# Patient Record
Sex: Male | Born: 1995 | Race: Black or African American | Hispanic: No | Marital: Single | Smoking: Never smoker
Health system: Southern US, Community
[De-identification: ages and names within clinical notes are randomized; demographics above are authoritative.]

## PROBLEM LIST (undated history)

## (undated) HISTORY — PX: ORIF FOOT FRACTURE: SHX2123

## (undated) HISTORY — PX: KNEE SURGERY: SHX244

---

## 2018-07-12 ENCOUNTER — Other Ambulatory Visit: Payer: Self-pay | Admitting: Hematology

## 2018-07-12 DIAGNOSIS — Z20822 Contact with and (suspected) exposure to covid-19: Secondary | ICD-10-CM

## 2018-07-12 NOTE — Progress Notes (Signed)
Patient is a Consulting civil engineer at [employer].  Referred for testing by Dr. Jolene Provost. Please send results to this provider.  Insurance information was not provided to me. /

## 2018-07-13 ENCOUNTER — Other Ambulatory Visit: Payer: Self-pay

## 2018-07-14 ENCOUNTER — Other Ambulatory Visit: Payer: Self-pay

## 2018-07-14 DIAGNOSIS — Z20822 Contact with and (suspected) exposure to covid-19: Secondary | ICD-10-CM

## 2018-07-16 LAB — NOVEL CORONAVIRUS, NAA: SARS-CoV-2, NAA: NOT DETECTED

## 2018-08-17 ENCOUNTER — Other Ambulatory Visit: Payer: Self-pay | Admitting: *Deleted

## 2018-08-17 DIAGNOSIS — Z20822 Contact with and (suspected) exposure to covid-19: Secondary | ICD-10-CM

## 2018-08-21 ENCOUNTER — Other Ambulatory Visit: Payer: Self-pay

## 2018-08-21 DIAGNOSIS — Z20822 Contact with and (suspected) exposure to covid-19: Secondary | ICD-10-CM

## 2018-08-26 LAB — NOVEL CORONAVIRUS, NAA: SARS-CoV-2, NAA: NOT DETECTED

## 2018-09-25 ENCOUNTER — Other Ambulatory Visit: Payer: Self-pay

## 2018-09-25 DIAGNOSIS — Z20822 Contact with and (suspected) exposure to covid-19: Secondary | ICD-10-CM

## 2018-09-26 LAB — NOVEL CORONAVIRUS, NAA: SARS-CoV-2, NAA: NOT DETECTED

## 2018-10-31 ENCOUNTER — Telehealth (INDEPENDENT_AMBULATORY_CARE_PROVIDER_SITE_OTHER): Payer: Self-pay | Admitting: Family Medicine

## 2018-10-31 ENCOUNTER — Other Ambulatory Visit: Payer: Self-pay

## 2018-10-31 DIAGNOSIS — Z20828 Contact with and (suspected) exposure to other viral communicable diseases: Secondary | ICD-10-CM

## 2018-10-31 DIAGNOSIS — R05 Cough: Secondary | ICD-10-CM

## 2018-10-31 DIAGNOSIS — Z20822 Contact with and (suspected) exposure to covid-19: Secondary | ICD-10-CM

## 2018-10-31 DIAGNOSIS — R6883 Chills (without fever): Secondary | ICD-10-CM

## 2018-10-31 DIAGNOSIS — R509 Fever, unspecified: Secondary | ICD-10-CM

## 2018-11-02 LAB — NOVEL CORONAVIRUS, NAA: SARS-CoV-2, NAA: DETECTED — AB

## 2018-11-04 ENCOUNTER — Telehealth: Payer: Self-pay | Admitting: Family Medicine

## 2018-11-04 MED ORDER — ALBUTEROL SULFATE HFA 108 (90 BASE) MCG/ACT IN AERS: 2.0000 | INHALATION_SPRAY | RESPIRATORY_TRACT | 1 refills | Status: AC | PRN

## 2018-11-04 NOTE — Progress Notes (Signed)
Telemedicine Visit  Patient asked for a refill of his inhaler. Patient is currently in quarantine for being + for COVID-19. Admits to feeling better now but still has some chest and nasal congestion. Has some tightness in his chest after coughing. Has hx of asthma as a child and uses an inhale occasionally when has a cold. Denies wheesing, chest pain, or shortness of breath walking around in his room. Denies swelling or pain of any extremities. Denies fever, diarrhea, severe headache.   NAD on the phone. Can speak in complete sentences without stopping.  No Exam.  A/P: COVID-19, chest congestion - will refill Albuterol Inhaler, recommend going to the ER for further evaluation and treatment if no improvement with inhaler use after use. Patient addresses understanding. Will continue to monitor for other cardiopulmonary symptoms and will seek immediate medical attention if needed.

## 2018-11-13 ENCOUNTER — Encounter: Payer: Self-pay | Admitting: Cardiology

## 2018-11-15 ENCOUNTER — Telehealth (INDEPENDENT_AMBULATORY_CARE_PROVIDER_SITE_OTHER): Payer: BLUE CROSS/BLUE SHIELD | Admitting: Family Medicine

## 2018-11-15 ENCOUNTER — Other Ambulatory Visit: Payer: Self-pay | Admitting: Family Medicine

## 2018-11-15 DIAGNOSIS — I400 Infective myocarditis: Secondary | ICD-10-CM

## 2018-11-15 DIAGNOSIS — Z8619 Personal history of other infectious and parasitic diseases: Secondary | ICD-10-CM

## 2018-11-15 DIAGNOSIS — Z8616 Personal history of COVID-19: Secondary | ICD-10-CM

## 2018-11-15 DIAGNOSIS — U071 COVID-19: Secondary | ICD-10-CM

## 2018-11-15 NOTE — Progress Notes (Signed)
Virtual Visit Agreed To By Patient Patient states he started with symptoms of chills, minimal cough and fever yesterday. Has taken Dayquil and Ibuprofen. Denies CP, SOB, N/V/D, abdominal pain, sore throat, severe headache.   ROS: Negative except mentioned above. GENERAL: NAD *No Exam   A/P: Fever - will send patient for COVID-19 testing, Tylenol/Ibuprofen prn, rest, hydration, quarantine for now until results discussed with patient, seek medical attention if needed as discussed, no athletic activity for now, will email student concerns who will contact patient.

## 2018-11-20 ENCOUNTER — Other Ambulatory Visit: Payer: Self-pay

## 2018-11-20 ENCOUNTER — Ambulatory Visit (INDEPENDENT_AMBULATORY_CARE_PROVIDER_SITE_OTHER): Payer: BLUE CROSS/BLUE SHIELD

## 2018-11-20 DIAGNOSIS — U071 COVID-19: Secondary | ICD-10-CM

## 2018-11-20 DIAGNOSIS — Z8619 Personal history of other infectious and parasitic diseases: Secondary | ICD-10-CM

## 2018-11-20 DIAGNOSIS — Z8616 Personal history of COVID-19: Secondary | ICD-10-CM

## 2018-11-20 DIAGNOSIS — I400 Infective myocarditis: Secondary | ICD-10-CM | POA: Diagnosis not present

## 2018-11-22 ENCOUNTER — Other Ambulatory Visit: Payer: Self-pay | Admitting: Family Medicine

## 2018-11-22 ENCOUNTER — Telehealth: Payer: Self-pay | Admitting: Family Medicine

## 2018-11-22 DIAGNOSIS — R931 Abnormal findings on diagnostic imaging of heart and coronary circulation: Secondary | ICD-10-CM

## 2018-11-22 DIAGNOSIS — Z8619 Personal history of other infectious and parasitic diseases: Secondary | ICD-10-CM

## 2018-11-22 DIAGNOSIS — Z8616 Personal history of COVID-19: Secondary | ICD-10-CM

## 2018-11-22 NOTE — Telephone Encounter (Signed)
Called athlete and informed him that I had a conversation with Dr. Saunders Revel, the cardiologist who read his echocardiogram. Dr. Saunders Revel suggests patient be seen by Cardiology this week for further discussion/evaluation. Patient has hx of COVID-19 but currently denies any symptoms. Troponin level was normal. ECG read by Dr. Rockey Situ will be faxed to Cardiology office in the morning prior to patient's appointment. 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  Patient addresses understanding of plan and is agreeable.

## 2018-11-23 ENCOUNTER — Other Ambulatory Visit: Payer: Self-pay

## 2018-11-23 ENCOUNTER — Encounter: Payer: Self-pay | Admitting: Cardiology

## 2018-11-23 ENCOUNTER — Ambulatory Visit (INDEPENDENT_AMBULATORY_CARE_PROVIDER_SITE_OTHER): Payer: BLUE CROSS/BLUE SHIELD | Admitting: Cardiology

## 2018-11-23 VITALS — BP 110/80 | HR 76 | Ht 75.5 in | Wt 238.0 lb

## 2018-11-23 DIAGNOSIS — Z8619 Personal history of other infectious and parasitic diseases: Secondary | ICD-10-CM

## 2018-11-23 DIAGNOSIS — Z8616 Personal history of COVID-19: Secondary | ICD-10-CM

## 2018-11-23 NOTE — Progress Notes (Addendum)
Cardiology Office Note:    Date:  11/23/2018   ID:  Noah Shah, DOB 07-23-1995, MRN [id number]  PCP:  Paulina Fusi, MD  Cardiologist:  Kate Sable, MD  Electrophysiologist:  None   Referring MD: Paulina Fusi, MD   Chief Complaint  Patient presents with  . New Patient (Initial Visit)    referred by PCP for Abnormal ECHO. Meds reviewed verbally with patient.     History of Present Illness:    Noah Shah is a 23 y.o. male with a hx of being positive for COVID-19 and presents due to an abnormal echo.  Patient attends [employer] and plays football.  He has played football ever since he was a kid around age 5.  He had symptoms of muscle aches back pain, fevers last month and was eventually tested for the coronavirus on 10/31/2018 (3 weeks ago) which came back positive.  Patient was placed in self isolation with resolution of symptoms.  He is currently asymptomatic, denies chest pain, shortness of breath, fevers, chills.  Troponin level was tested and was normal as per PCP.  His ECG showed normal sinus rhythm, some interventricular conduction delay, otherwise normal electrocardiogram.  History reviewed. No pertinent past medical history.  Past Surgical History:  Procedure Laterality Date  . KNEE SURGERY     x2  . ORIF FOOT FRACTURE      Current Medications: Current Meds  Medication Sig  . albuterol (VENTOLIN HFA) 108 (90 Base) MCG/ACT inhaler Inhale 2 puffs into the lungs every 4 (four) hours as needed for wheezing or shortness of breath.     Allergies:   Patient has no allergy information on record.   Social History   Socioeconomic History  . Marital status: Single    Spouse name: Not on file  . Number of children: Not on file  . Years of education: Not on file  . Highest education level: Not on file  Occupational History  . Not on file  Social Needs  . Financial resource strain: Not on file  . Food insecurity    Worry: Not on file   Inability: Not on file  . Transportation needs    Medical: Not on file    Non-medical: Not on file  Tobacco Use  . Smoking status: Never Smoker  . Smokeless tobacco: Never Used  Substance and Sexual Activity  . Alcohol use: Never    Frequency: Never  . Drug use: Never  . Sexual activity: Not on file  Lifestyle  . Physical activity    Days per week: Not on file    Minutes per session: Not on file  . Stress: Not on file  Relationships  . Social Herbalist on phone: Not on file    Gets together: Not on file    Attends religious service: Not on file    Active member of club or organization: Not on file    Attends meetings of clubs or organizations: Not on file    Relationship status: Not on file  Other Topics Concern  . Not on file  Social History Narrative  . Not on file     Family History: The patient's. Denies family history of cardiac disease.  ROS:   Please see the history of present illness.     All other systems reviewed and are negative.  EKGs/Labs/Other Studies Reviewed:    The following studies were reviewed today:  Echocardiogram dated 11/20/2018 IMPRESSIONS    1. Left  ventricular ejection fraction, by visual estimation, is 50 to 55%. The left ventricle has normal function. Normal left ventricular size. There is no left ventricular hypertrophy.  2. Global right ventricle has normal systolic function.The right ventricular size is mildly enlarged. No increase in right ventricular wall thickness.  3. Left atrial size was normal.  4. Right atrial size was mildly dilated.  5. The mitral valve is normal in structure. Mild mitral valve regurgitation. No evidence of mitral stenosis.  6. The tricuspid valve is grossly normal. Tricuspid valve regurgitation is trivial.  7. The aortic valve has an indeterminant number of cusps Aortic valve regurgitation was not visualized by color flow Doppler. There is no aortic valve stenosis.  8. The pulmonic valve was  normal in structure. Pulmonic valve regurgitation is trivial by color flow Doppler.  9. TR signal is inadequate for assessing pulmonary artery systolic pressure. 10. The inferior vena cava is normal in size with greater than 50% respiratory variability, suggesting right atrial pressure of 3 mmHg. 11. The interatrial septum was not well visualized.  EKG:  EKG is not ordered ordered today.   ECG reviewed from PCP office showed normal sinus rhythm, some interventricular conduction delay, otherwise normal ECG.  Recent Labs: No results found for requested labs within last 8760 hours.  Recent Lipid Panel No results found for: CHOL, TRIG, HDL, CHOLHDL, VLDL, LDLCALC, LDLDIRECT  Physical Exam:    VS:  BP 110/80 (BP Location: Left Arm, Patient Position: Sitting, Cuff Size: Normal)   Pulse 76   Ht 6' 3.5" (1.918 m)   Wt 238 lb (108 kg)   BMI 29.36 kg/m     Wt Readings from Last 3 Encounters:  11/23/18 238 lb (108 kg)     GEN:  Well nourished, well developed in no acute distress HEENT: Normal NECK: No JVD; No carotid bruits LYMPHATICS: No lymphadenopathy CARDIAC: RRR, no murmurs, rubs, gallops RESPIRATORY:  Clear to auscultation without rales, wheezing or rhonchi  ABDOMEN: Soft, non-tender, non-distended MUSCULOSKELETAL:  No edema; No deformity  SKIN: Warm and dry NEUROLOGIC:  Alert and oriented x 3 PSYCHIATRIC:  Normal affect   ASSESSMENT:   His echocardiogram did reveal mild right atrial enlargement and mild right ventricular enlargement.  Troponin levels and ECG obtained from the PCP office was normal.  Patient is without symptoms.  With regards to cardiac involvement in light of coronavirus infection, a lot of the treatment options or guidelines is based on expert opinions.  It is even more difficult to ascertain etiology of cardiac changes in athletes because these patients by enlarge have some echocardiographic changes due to physiologic cardiac remodeling secondary to exercise.   With that said, leaning on one such recent guidelines from [facility] cardiology, Mr. Tomich troponin level was normal and his EKG was also normal which points away from myocarditis although still possible.  His echocardiogram showing mild RA and RV enlargement is likely secondary to athletic heart or cardiac remodeling secondary to exercise.  I do not think the mild chamber changes due to coronavirus infection or myocarditis.  1. History of 2019 novel coronavirus disease (COVID-19)    PLAN:    In order of problems listed above:  1. I recommend repeat echocardiogram in 3 to 6 months to make sure the current cardiac changes are stable or not worsening.  I advised slow resumption of athletic activity under the guidance of healthcare team with close monitoring for any clinical deterioration..  Total encounter time more than 45 minutes  Greater  than 50% was spent in counseling and coordination of care with the patient   Medication Adjustments/Labs and Tests Ordered: Current medicines are reviewed at length with the patient today.  Concerns regarding medicines are outlined above.  Orders Placed This Encounter  Procedures  . ECHOCARDIOGRAM COMPLETE   No orders of the defined types were placed in this encounter.   Patient Instructions  Medication Instructions:  Your physician recommends that you continue on your current medications as directed. Please refer to the Current Medication list given to you today.   If you need a refill on your cardiac medications before your next appointment, please call your pharmacy.   Lab work: NONE If you have labs (blood work) drawn today and your tests are completely normal, you will receive your results only by: Marland Kitchen. MyChart Message (if you have MyChart) OR . A paper copy in the mail If you have any lab test that is abnormal or we need to change your treatment, we will call you to review the results.  Testing/Procedures: Your physician has requested that  you have an echocardiogram IN 3-6 MONTHS. Echocardiography is a painless test that uses sound waves to create images of your heart. It provides your doctor with information about the size and shape of your heart and how well your heart's chambers and valves are working. This procedure takes approximately one hour. There are no restrictions for this procedure.    Follow-Up: At [facility], you and your health needs are our priority.  As part of our continuing mission to provide you with exceptional heart care, we have created designated Provider Care Teams.  These Care Teams include your primary Cardiologist (physician) and Advanced Practice Providers (APPs -  Physician Assistants and Nurse Practitioners) who all work together to provide you with the care you need, when you need it. You will need a follow up appointment in 3-6 months AFTER YOU HAVE HAD THE ECHO.  You may see Debbe OdeaBrian Agbor-Etang, MD or one of the following Advanced Practice Providers on your designated Care Team:   Nicolasa Duckinghristopher Berge, NP Eula Listenyan Dunn, PA-C . Marisue IvanJacquelyn Visser, PA-C         Signed, Debbe OdeaBrian Agbor-Etang, MD  11/23/2018 10:14 AM     [facility]

## 2018-11-23 NOTE — Patient Instructions (Signed)
Medication Instructions:  Your physician recommends that you continue on your current medications as directed. Please refer to the Current Medication list given to you today.   If you need a refill on your cardiac medications before your next appointment, please call your pharmacy.   Lab work: NONE If you have labs (blood work) drawn today and your tests are completely normal, you will receive your results only by: Marland Kitchen MyChart Message (if you have MyChart) OR . A paper copy in the mail If you have any lab test that is abnormal or we need to change your treatment, we will call you to review the results.  Testing/Procedures: Your physician has requested that you have an echocardiogram IN 3-6 MONTHS. Echocardiography is a painless test that uses sound waves to create images of your heart. It provides your doctor with information about the size and shape of your heart and how well your heart's chambers and valves are working. This procedure takes approximately one hour. There are no restrictions for this procedure.    Follow-Up: At [facility], you and your health needs are our priority.  As part of our continuing mission to provide you with exceptional heart care, we have created designated Provider Care Teams.  These Care Teams include your primary Cardiologist (physician) and Advanced Practice Providers (APPs -  Physician Assistants and Nurse Practitioners) who all work together to provide you with the care you need, when you need it. You will need a follow up appointment in 3-6 months [place] [facility].  You may see Kate Sable, MD or one of the following Advanced Practice Providers on your designated Care Team:   Murray Hodgkins, NP Christell Faith, PA-C . Marrianne Mood, PA-C

## 2018-11-25 NOTE — Progress Notes (Signed)
NO VISIT.

## 2018-11-26 ENCOUNTER — Telehealth: Payer: Self-pay | Admitting: Family Medicine

## 2018-11-26 NOTE — Telephone Encounter (Signed)
Called and explained to patient that Cardiology clinic notes were reviewed and recommendation was to repeat Echo in 3-6 months. Cardiology said he could resume physical activity. Patient is to seek medical attention if any cardiopulmonary symptoms develop once activity is resumed. Patient addresses understanding and has no further questions or concerns.

## 2018-11-30 ENCOUNTER — Telehealth: Payer: BLUE CROSS/BLUE SHIELD | Admitting: Family Medicine

## 2018-11-30 DIAGNOSIS — B360 Pityriasis versicolor: Secondary | ICD-10-CM

## 2018-12-03 NOTE — Progress Notes (Signed)
Virtual Visit Agreed To By Patient  Patient has had non-pruritic rash on torso for the last few weeks. It is mostly on the upper trap. Area. He denies any other symptoms. Describes the rash has being lighter spots. Denies rash on face. Admits to having this rash in the past and being diagnosed with tinea versicolor. He believes he was prescribed a medication in the past. Currently has not been using anything on the rash.    ROS: Negative except mentioned above. No Vitals  GENERAL: NAD SKIN: no rash noted on face, a few areas of hypopigmentation noted, difficult to see entire rash on torso NEURO: CN II-XII grossly intact   A/P: Skin Rash - likely related to tinea versicolor, discussed using Selsun Blue for 7-10 days, if symptoms persist or worsen will consider seeing patient in person and/or prescribing oral antifungal like Diflucan. Discouraged wearing restrictive clothing. Encouraged showering after activity and patting dry.

## 2018-12-18 ENCOUNTER — Other Ambulatory Visit: Payer: Self-pay | Admitting: Family Medicine

## 2018-12-18 ENCOUNTER — Ambulatory Visit
Admission: RE | Admit: 2018-12-18 | Discharge: 2018-12-18 | Disposition: A | Discharge status: Home / Self Care | Payer: BLUE CROSS/BLUE SHIELD | Source: Ambulatory Visit | Attending: Family Medicine | Admitting: Family Medicine

## 2018-12-18 ENCOUNTER — Other Ambulatory Visit: Payer: Self-pay

## 2018-12-18 DIAGNOSIS — S6991XA Unspecified injury of right wrist, hand and finger(s), initial encounter: Secondary | ICD-10-CM

## 2019-03-21 ENCOUNTER — Telehealth: Payer: Self-pay | Admitting: Cardiology

## 2019-03-21 NOTE — Telephone Encounter (Signed)
3 attempts to schedule fu appt from recall list.   Deleting recall.   

## 2019-04-28 ENCOUNTER — Ambulatory Visit
Admission: RE | Admit: 2019-04-28 | Discharge: 2019-04-28 | Disposition: A | Discharge status: Home / Self Care | Payer: BLUE CROSS/BLUE SHIELD | Source: Ambulatory Visit | Attending: Sports Medicine | Admitting: Sports Medicine

## 2019-04-28 DIAGNOSIS — S92321A Displaced fracture of second metatarsal bone, right foot, initial encounter for closed fracture: Secondary | ICD-10-CM | POA: Insufficient documentation

## 2019-04-28 DIAGNOSIS — Y9361 Activity, american tackle football: Secondary | ICD-10-CM | POA: Insufficient documentation

## 2019-04-28 DIAGNOSIS — Z0189 Encounter for other specified special examinations: Secondary | ICD-10-CM | POA: Insufficient documentation

## 2019-04-28 DIAGNOSIS — S92331A Displaced fracture of third metatarsal bone, right foot, initial encounter for closed fracture: Secondary | ICD-10-CM | POA: Insufficient documentation

## 2019-04-28 DIAGNOSIS — M79671 Pain in right foot: Secondary | ICD-10-CM | POA: Insufficient documentation

## 2019-05-01 ENCOUNTER — Other Ambulatory Visit: Payer: Self-pay | Admitting: Sports Medicine

## 2019-05-01 DIAGNOSIS — S93601D Unspecified sprain of right foot, subsequent encounter: Secondary | ICD-10-CM

## 2019-05-02 ENCOUNTER — Ambulatory Visit
Admission: RE | Admit: 2019-05-02 | Discharge: 2019-05-02 | Disposition: A | Discharge status: Home / Self Care | Payer: BLUE CROSS/BLUE SHIELD | Source: Ambulatory Visit | Attending: Sports Medicine | Admitting: Sports Medicine

## 2019-05-02 ENCOUNTER — Other Ambulatory Visit: Payer: Self-pay

## 2019-05-02 DIAGNOSIS — S93601D Unspecified sprain of right foot, subsequent encounter: Secondary | ICD-10-CM | POA: Insufficient documentation

## 2019-05-04 ENCOUNTER — Other Ambulatory Visit: Payer: Self-pay | Admitting: Sports Medicine

## 2019-05-04 ENCOUNTER — Ambulatory Visit
Admission: RE | Admit: 2019-05-04 | Discharge: 2019-05-04 | Disposition: A | Discharge status: Home / Self Care | Payer: BLUE CROSS/BLUE SHIELD | Source: Ambulatory Visit | Attending: Sports Medicine | Admitting: Sports Medicine

## 2019-05-04 DIAGNOSIS — Y9361 Activity, american tackle football: Secondary | ICD-10-CM | POA: Diagnosis not present

## 2019-05-04 DIAGNOSIS — S92321A Displaced fracture of second metatarsal bone, right foot, initial encounter for closed fracture: Secondary | ICD-10-CM | POA: Diagnosis not present

## 2019-05-04 DIAGNOSIS — R52 Pain, unspecified: Secondary | ICD-10-CM

## 2019-05-04 DIAGNOSIS — T1490XA Injury, unspecified, initial encounter: Secondary | ICD-10-CM

## 2019-05-04 DIAGNOSIS — Z0189 Encounter for other specified special examinations: Secondary | ICD-10-CM | POA: Diagnosis not present

## 2019-05-04 DIAGNOSIS — M79671 Pain in right foot: Secondary | ICD-10-CM | POA: Diagnosis present

## 2019-05-04 DIAGNOSIS — S92331A Displaced fracture of third metatarsal bone, right foot, initial encounter for closed fracture: Secondary | ICD-10-CM | POA: Diagnosis not present

## 2019-10-30 ENCOUNTER — Ambulatory Visit
Admission: RE | Admit: 2019-10-30 | Discharge: 2019-10-30 | Disposition: A | Discharge status: Home / Self Care | Payer: PRIVATE HEALTH INSURANCE | Attending: Sports Medicine | Admitting: Sports Medicine

## 2019-10-30 ENCOUNTER — Other Ambulatory Visit: Payer: Self-pay | Admitting: Hematology & Oncology

## 2019-10-30 ENCOUNTER — Ambulatory Visit
Admission: RE | Admit: 2019-10-30 | Discharge: 2019-10-30 | Disposition: A | Discharge status: Home / Self Care | Payer: PRIVATE HEALTH INSURANCE | Source: Ambulatory Visit | Attending: Hematology & Oncology | Admitting: Hematology & Oncology

## 2019-10-30 DIAGNOSIS — M25562 Pain in left knee: Secondary | ICD-10-CM

## 2019-10-30 DIAGNOSIS — M25462 Effusion, left knee: Secondary | ICD-10-CM

## 2021-12-22 IMAGING — CR DG KNEE COMPLETE 4+V*L*
1 series · 4 of 4 positions shown · non-contrast
Comparison: None.

CLINICAL DATA: Left knee pain x2 weeks.

EXAM:
LEFT KNEE - COMPLETE 4+ VIEW

[Series 1: dg knee complete 4 views left · 0.14mm/px · 4 of 4 slices shown]
[im 1/4]
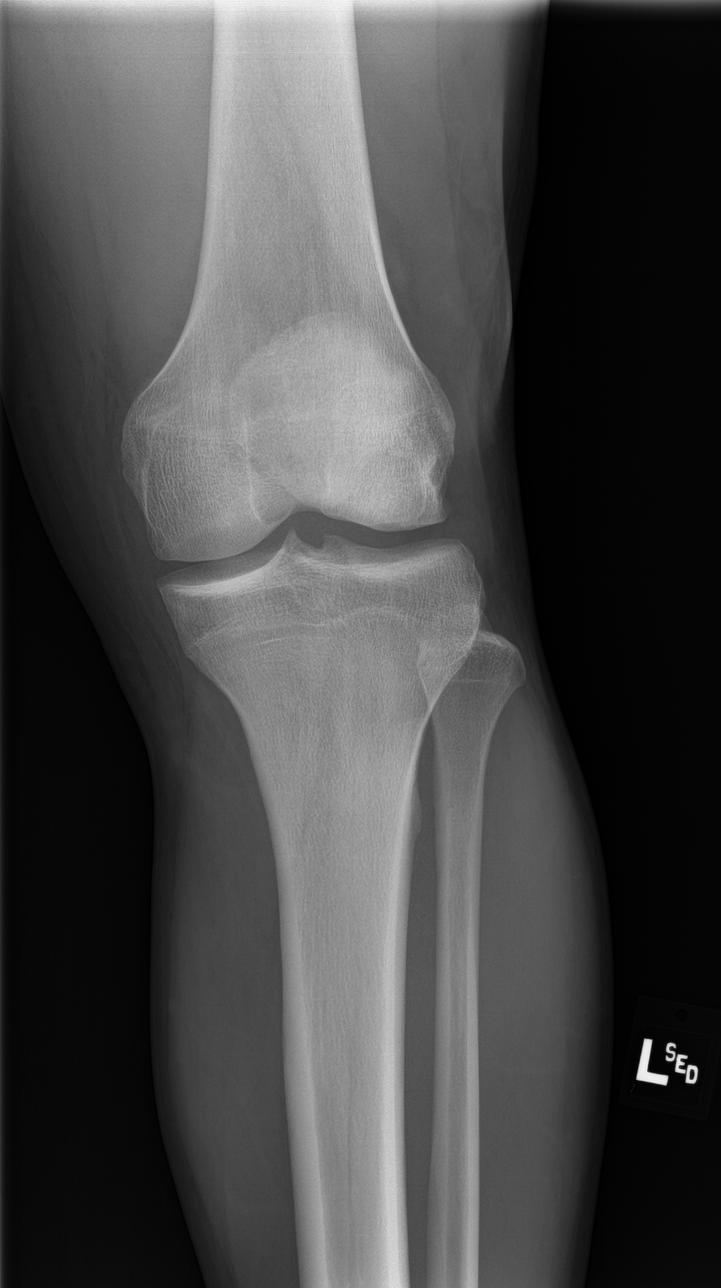
[im 2/4]
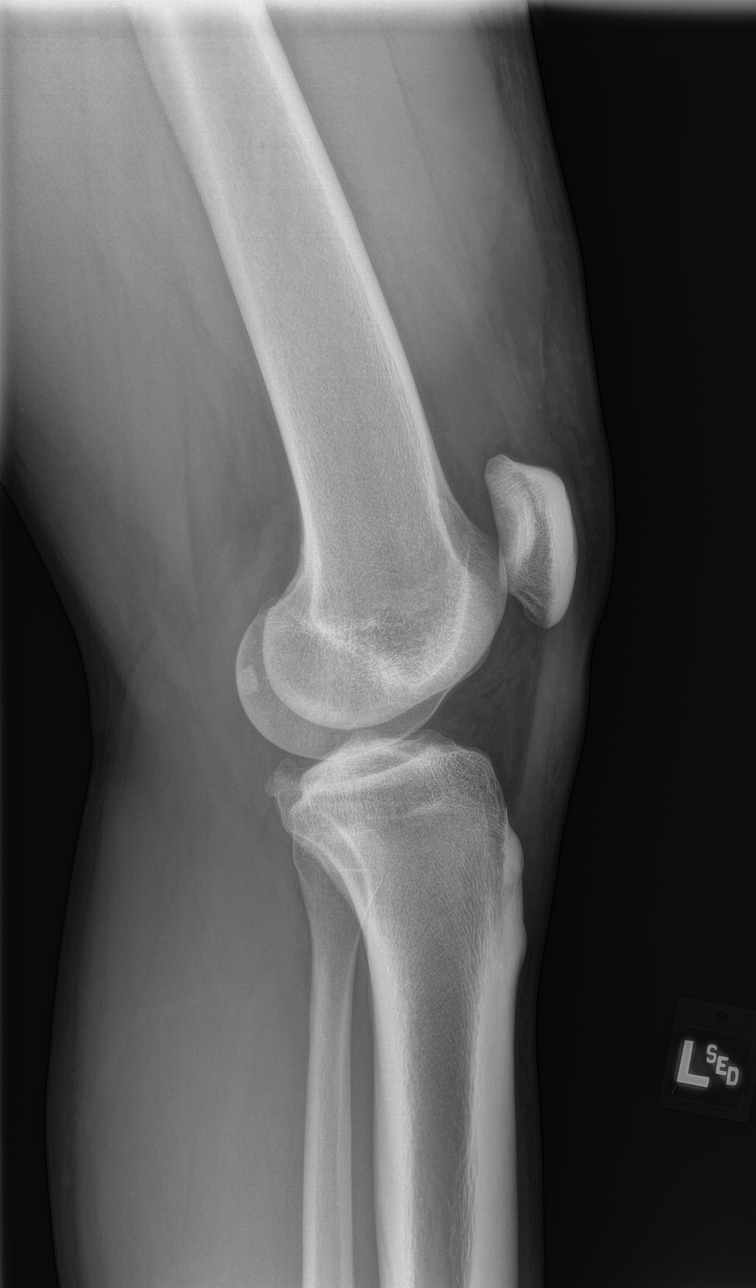
[im 3/4]
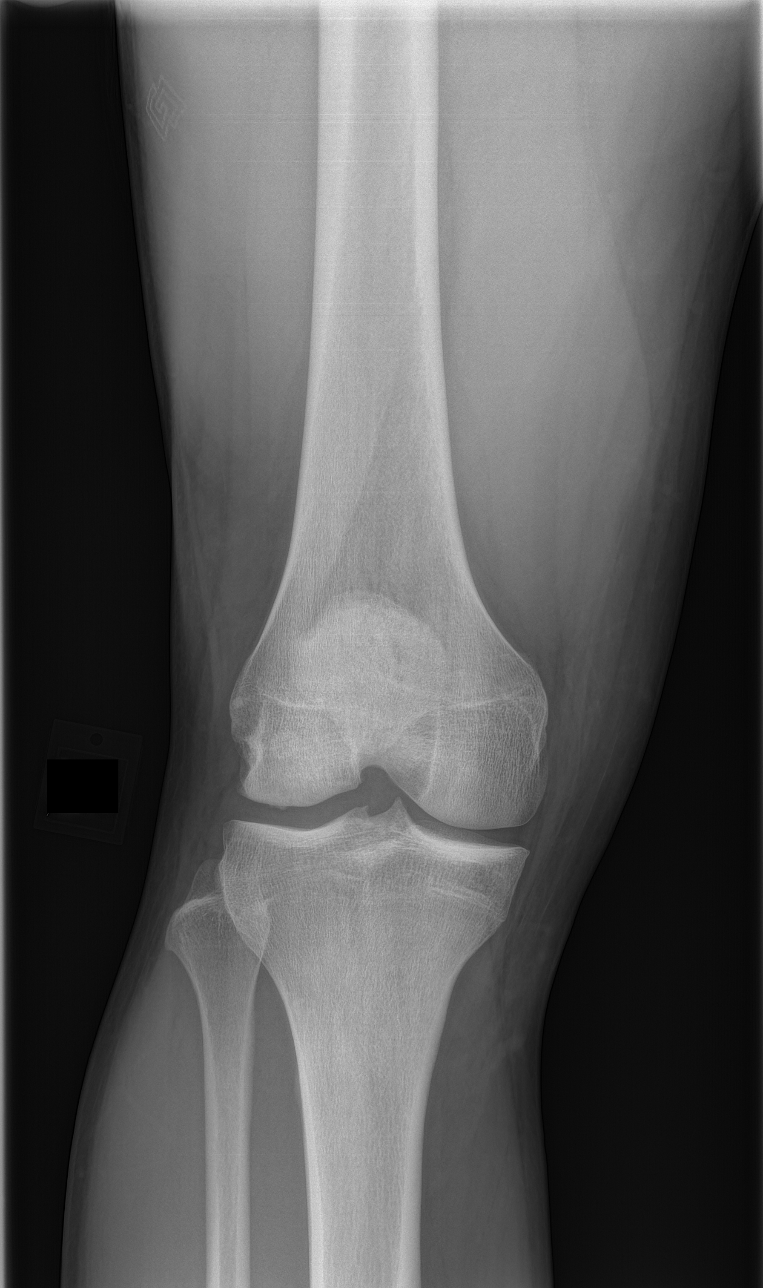
[im 4/4]
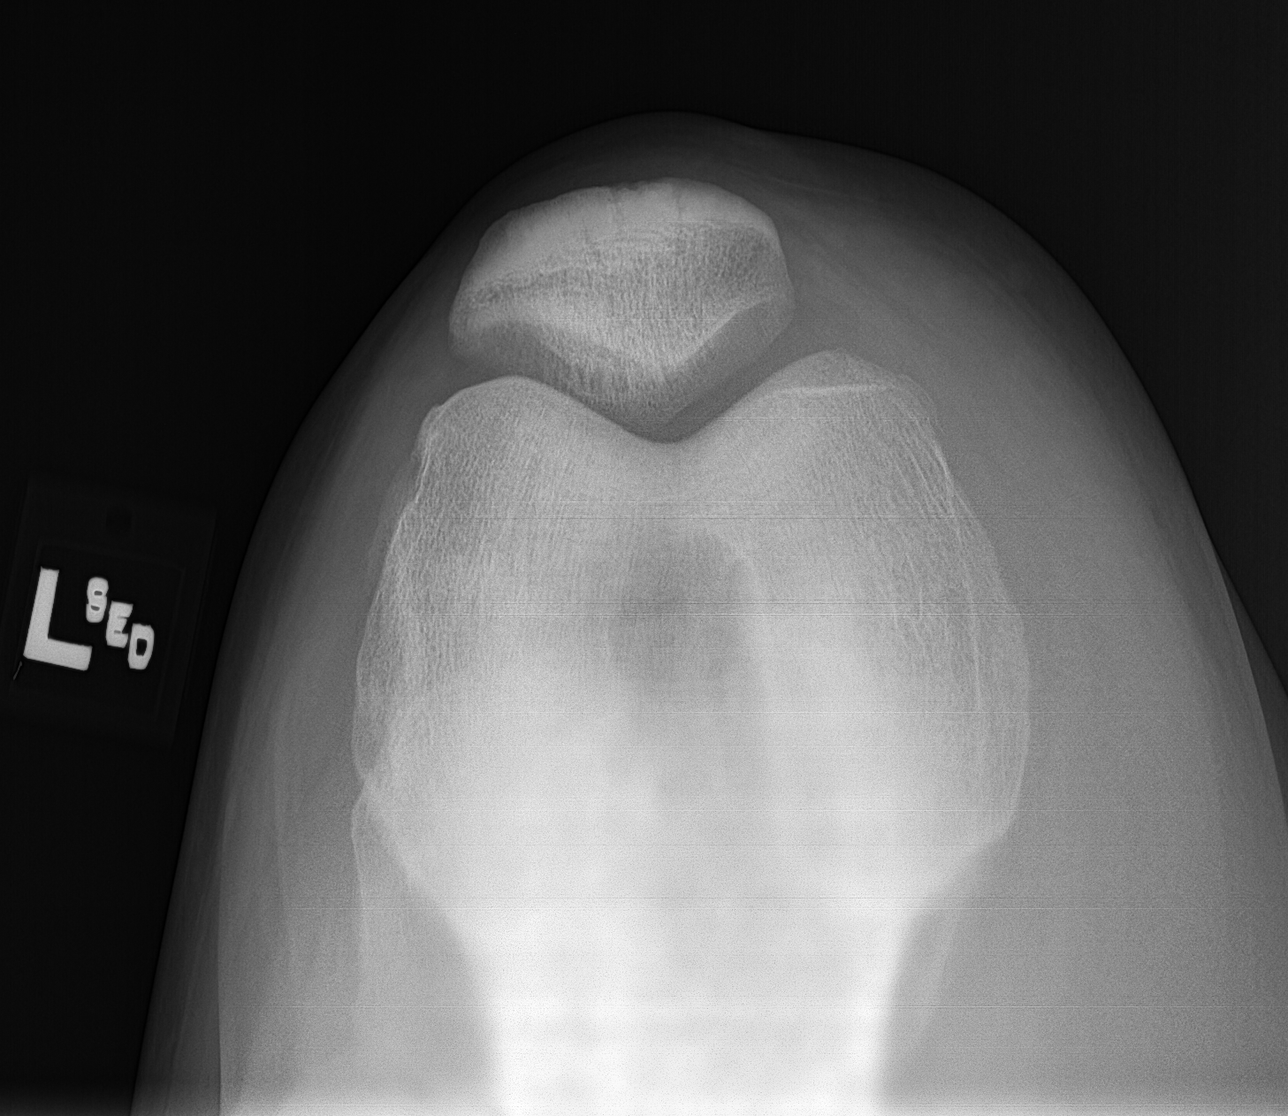

[4 of 4 positions shown; findings below may reference images not displayed]

FINDINGS: No evidence of an acute fracture or dislocation. No evidence of
arthropathy. An 8 mm x 5 mm well-defined sclerotic focus is seen
within the posterior aspect of the lateral femoral epicondyle. A
small joint effusion is seen.
IMPRESSION: 1. Small joint effusion.
2. No acute fracture or dislocation.
3. Small sclerotic focus within the lateral femoral epicondyle which
may represent a small bone island.
